# Patient Record
Sex: Male | Born: 1965 | Race: White | Hispanic: No | Marital: Married | Smoking: Never smoker
Health system: Southern US, Community
[De-identification: ages and names within clinical notes are randomized; demographics above are authoritative.]

## PROBLEM LIST (undated history)

## (undated) DIAGNOSIS — J302 Other seasonal allergic rhinitis: Secondary | ICD-10-CM

## (undated) DIAGNOSIS — M1990 Unspecified osteoarthritis, unspecified site: Secondary | ICD-10-CM

## (undated) DIAGNOSIS — M7540 Impingement syndrome of unspecified shoulder: Secondary | ICD-10-CM

## (undated) HISTORY — DX: Other seasonal allergic rhinitis: J30.2

## (undated) HISTORY — PX: HAND SURGERY: SHX662

---

## 2008-03-11 ENCOUNTER — Emergency Department (HOSPITAL_COMMUNITY): Admission: EM | Admit: 2008-03-11 | Discharge: 2008-03-12 | Payer: Self-pay | Admitting: Emergency Medicine

## 2010-03-23 ENCOUNTER — Ambulatory Visit: Payer: Self-pay | Admitting: Family Medicine

## 2010-03-23 DIAGNOSIS — M84369A Stress fracture, unspecified tibia and fibula, initial encounter for fracture: Secondary | ICD-10-CM

## 2010-03-23 DIAGNOSIS — M79609 Pain in unspecified limb: Secondary | ICD-10-CM

## 2010-10-19 NOTE — Assessment & Plan Note (Signed)
Summary: NP RUNNER RT ANKLE PAIN/SWELLING   Vital Signs:  Patient profile:   45 year old male Height:      72 inches Weight:      190 pounds BMI:     25.86 BP sitting:   122 / 81  Vitals Entered By: Lillia Pauls CMA (March 23, 2010 10:10 AM)  History of Present Illness: 45 yo male with right ankle pain and swelling x 2-3 weeks, acutely worsening the past 7 days. s/p injury and fracture of right fibula about 2 years ago while playing soccer.  At that time he was seen at [facility], placed in boot/cast for about 2 months; no surgery needed, did a few sessions of PT.  Pt reports minimal formal PT and HEP after injury.   Recently has begun training for a marathon (end of June); over about 2 weeks has been increasing mileage from baseline of 3-5 miles weekly, to current distance of 20-25 miles weekly. Runs on concrete roads. Endorses pain on medial malleoli and at the distal tibia.  Pain worsens as distance increases.  Describes pain as bone pain with no radiation.  No weakness in ankle or foot, no recent injuries.  Noticed swelling after run last week which responded well to rest, ice and Ibuprofen.     Allergies (verified): No Known Drug Allergies  Past History:  Past Medical History: h/o R fibular fx, ? mortise disruption or other medial injury, non-op, Dr. Eulah Pont  Family History: n/c  Social History: pleasant runner  Review of Systems       REVIEW OF SYSTEMS  GEN: No systemic complaints, no fevers, chills, sweats, or other acute illnesses MSK: Detailed in the HPI GI: tolerating PO intake without difficulty Neuro: No numbness, parasthesias, or tingling associated. Otherwise the pertinent positives of the ROS are noted above.    Physical Exam  Head:  Normocephalic and atraumatic.  Ears:  ext normal Nose:  ext normal Lungs:  breathing comfortably   Foot/Ankle Exam  General:    Well-developed, well-nourished ,normal body habitus; no deformities, Vs  reviewed  Gait:    Normal heel-toe gait pattern bilaterally.    Vascular:    dorsalis pedis and posterior tibial pulses 2+ and symmetric, capillary refill < 2 seconds, normal hair pattern, no evidence of ischemia.   Sensory:    gross sensation intact bilaterally in lower extremities.    Motor:    Motor strength 5/5 bilaterally for ankle dorsiflexion, ankle plantar flexion, ankle inversion and ankle eversion.    Reflexes:    Normal and symmetric Achilles reflexes bilaterally.    Ankle Exam:    Right:    Inspection:  Normal    Palpation:  Abnormal       Location:  medial malleolus    Stability:  stable    Tenderness:  yes    Swelling:  yes    Erythema:  no    Mild swelling noted at medial malleolus. Tenderness at medial malleolus and distal tibia  + PAIN WITH HOP TEST BUT ABLE TO COMPLETE 10 B    Range of Motion:       Dorsiflex-Active: 60       Plantar Flex-Active: 45       Eversion-Active: 45       Inversion-Active: 75       Dorsiflex-Passive: 60       Plantar Flex-Passive: 45       Eversion-Passive: 45       Inversion-Passive: 75  Transverse Tarsal: full    Left:    Inspection:  Normal    Palpation:  Normal    Stability:  stable    Tenderness:  no    Swelling:  no    Erythema:  no    Range of Motion:       Dorsiflex-Active: 60       Plantar Flex-Active: 45       Eversion-Active: 45       Inversion-Active: 75       Dorsiflex-Passive: 60       Plantar Flex-Passive: 45       Eversion-Passive: 45       Inversion-Passive: 75       Transverse Tarsal: full  Foot Exam:    Right:    Inspection:  Normal    Palpation:  Normal    Stability:  stable    Tenderness:  no    Swelling:  no    Erythema:  no    Range of Motion:       Hallux MTP Dorsiflex-Active: 60       Hallux MTP Plantar Flex-Active: 45       Hallux IP-Active: full       Hallux MTP Dorsiflex-Passive: 60       Hallux MTP Plantar Flex-Passive: 45       Hallux IP-Passive: full    Left:     Inspection:  Normal    Palpation:  Normal    Stability:  stable    Tenderness:  no    Swelling:  no    Erythema:  no    Range of Motion:       Hallux MTP Dorsiflex-Active: 60       Hallux MTP Plantar Flex-Active: 45       Hallux IP-Active: full       Hallux MTP Dorsiflex-Passive: 60       Hallux MTP Plantar Flex-Passive: 45       Hallux IP-Passive: full  Anterior Drawer:    Right negative; Left negative Syndesmosis Squeeze Test:    Right negative; Left negative Ankle External Rotation Test:    Right negative; Left negative   Impression & Recommendations:  Problem # 1:  LEG PAIN, RIGHT (ICD-729.5) Assessment New Likely due to acute overuse and sudden increase in distance running - suspect distal tib stress reaction   Advised no running at this time since  impact will worsen symptoms. OK to cycle and swim as long as he does not have pain. Re-iterated that he should modify his activities to avoid pain and worsening his symptoms. Ice for 20 minutes daily Can use  NSAIDs as long as he is not engaging in long distance runs especially in the heat.  Return in 2 weeks for follow-up or sooner if symptoms worsen  Problem # 2:  STRESS FRACTURE OF TIBIA OR FIBULA (ICD-733.93) Assessment: New c/w stress reaction, distal medial tibia

## 2015-05-12 ENCOUNTER — Other Ambulatory Visit: Payer: Self-pay | Admitting: Orthopedic Surgery

## 2015-05-12 DIAGNOSIS — M25511 Pain in right shoulder: Secondary | ICD-10-CM

## 2015-05-19 ENCOUNTER — Ambulatory Visit
Admission: RE | Admit: 2015-05-19 | Discharge: 2015-05-19 | Disposition: A | Discharge status: Home / Self Care | Payer: No Typology Code available for payment source | Source: Ambulatory Visit | Attending: Orthopedic Surgery | Admitting: Orthopedic Surgery

## 2015-05-19 DIAGNOSIS — M25511 Pain in right shoulder: Secondary | ICD-10-CM

## 2015-06-30 ENCOUNTER — Other Ambulatory Visit: Payer: Self-pay | Admitting: Physician Assistant

## 2015-06-30 NOTE — H&P (Signed)
Jay Williams is an old patient of mine, as well as a friend.  I haven't seen him in a couple of years.  I have talked to him on a number of occasions over the last couple of years about increasing right shoulder pain.  All of this located in and around the rotator cuff.  It has gotten to a point that he has had to give up trying to hit tennis balls overhead.  Any throwing motion is bothering him more and more.  He is able to play golf, but this is his right, not his left, shoulder and he is right handed.  He is starting to get some rest pain and night pain.  Alteration of activities and anti-inflammatories without improvement.  He has not had an injection or further workup.  No symptoms on the left.  Otherwise great health. History and general exam is reviewed, updated and included in the chart.   EXAMINATION: Specifically, healthy appearing 49 year-old male.  I can get his right shoulder through full motion, but he has positive impingement, positive palms down abduction and a little give way weakness.  AC joint a little sore.  Biceps is intact.  No apprehension or instability.  Opposite left shoulder full motion.  No impingement signs.  Good strength.    X-RAYS: Three view x-ray of the right shows a Type II acromion.  A little bit of spurring.  Reasonable subacromial space.  Moderate changes AC joint.  Subacromial space and glenohumeral joint look good.   IMPRESSION: Chronic persistent impingement, right dominant shoulder.  A number of years.    PLAN: I think this is going to come down to operative treatment based on longevity.  We are going to try a subacromial injection, followed by Jobe exercise program.  MRI to look at his cuff.  Depending on his response to injection and what his scan looks like we will make a decision about treatment.  He understands and agrees.  He will call me after the scan.    PROCEDURE NOTE: The patient's clinical condition is marked by substantial pain and/or significant  functional disability.  Other conservative therapy has not provided relief, is contraindicated, or not appropriate.  There is a reasonable likelihood that injection will significantly improve the patient's pain and/or functional disability. After appropriate consent and under sterile technique subacromial injection of the right shoulder from a posterior approach with 40 mg of Depo-Medrol and Marcaine.  Tolerated this well.  I went over Jobe exercises.    Ninetta Lights, M.D.  Addendum:  I met and spoke with Jay Williams in regards to the scan of his right shoulder.  He continues to have significant symptoms.  MRI has been completed and reviewed.  This shows significant arthropathy at the [facility] joint causing impingement.  Type II acromion.  His cuff otherwise is intact, as is his biceps tendon, which is what I expected.  He also has an anterior labrum tear with a paralabral cyst, but I don't think this represents a true SLAP lesion.  I have gone over the results and discussed them and reviewed them with Jay Williams.  He has had symptoms now for almost two years.  Only transient improvement with injection and an exercise program.  We have discussed definitive treatment.  He would like to proceed.  We are going to try to do this later this fall.  Exam under anesthesia, arthroscopy.  Debridement of his labral tear and labral cyst.  I don't think I am going to  have to do anything else in regards to that part of the pathology.  Subacromial decompression, bursectomy and distal clavicle excision.  Procedure, risks, benefits and complications reviewed.  Paperwork complete.  I will see him at the time of operative intervention.    Ninetta Lights, M.D.

## 2015-07-07 ENCOUNTER — Encounter (HOSPITAL_BASED_OUTPATIENT_CLINIC_OR_DEPARTMENT_OTHER): Payer: Self-pay | Admitting: *Deleted

## 2015-07-09 ENCOUNTER — Ambulatory Visit (HOSPITAL_BASED_OUTPATIENT_CLINIC_OR_DEPARTMENT_OTHER): Payer: No Typology Code available for payment source | Admitting: Anesthesiology

## 2015-07-09 ENCOUNTER — Encounter (HOSPITAL_BASED_OUTPATIENT_CLINIC_OR_DEPARTMENT_OTHER)
Admission: RE | Disposition: A | Discharge status: Home / Self Care | Payer: Self-pay | Source: Ambulatory Visit | Attending: Orthopedic Surgery

## 2015-07-09 ENCOUNTER — Ambulatory Visit (HOSPITAL_BASED_OUTPATIENT_CLINIC_OR_DEPARTMENT_OTHER)
Admission: RE | Admit: 2015-07-09 | Discharge: 2015-07-09 | Disposition: A | Discharge status: Home / Self Care | Payer: No Typology Code available for payment source | Source: Ambulatory Visit | Attending: Orthopedic Surgery | Admitting: Orthopedic Surgery

## 2015-07-09 ENCOUNTER — Encounter (HOSPITAL_BASED_OUTPATIENT_CLINIC_OR_DEPARTMENT_OTHER): Payer: Self-pay | Admitting: *Deleted

## 2015-07-09 DIAGNOSIS — M89511 Osteolysis, right shoulder: Secondary | ICD-10-CM | POA: Diagnosis present

## 2015-07-09 DIAGNOSIS — X58XXXA Exposure to other specified factors, initial encounter: Secondary | ICD-10-CM | POA: Diagnosis not present

## 2015-07-09 DIAGNOSIS — S43431A Superior glenoid labrum lesion of right shoulder, initial encounter: Secondary | ICD-10-CM | POA: Insufficient documentation

## 2015-07-09 DIAGNOSIS — M7541 Impingement syndrome of right shoulder: Secondary | ICD-10-CM | POA: Diagnosis not present

## 2015-07-09 HISTORY — DX: Impingement syndrome of unspecified shoulder: M75.40

## 2015-07-09 HISTORY — PX: SHOULDER ARTHROSCOPY WITH DISTAL CLAVICLE RESECTION: SHX5675

## 2015-07-09 HISTORY — DX: Unspecified osteoarthritis, unspecified site: M19.90

## 2015-07-09 HISTORY — PX: SHOULDER ARTHROSCOPY WITH SUBACROMIAL DECOMPRESSION: SHX5684

## 2015-07-09 SURGERY — SHOULDER ARTHROSCOPY WITH SUBACROMIAL DECOMPRESSION
Anesthesia: General | Site: Shoulder | Laterality: Right

## 2015-07-09 MED ORDER — ONDANSETRON HCL 4 MG/2ML IJ SOLN: 4.0000 mg | Freq: Once | INTRAMUSCULAR | Status: DC | PRN

## 2015-07-09 MED ORDER — FENTANYL CITRATE (PF) 100 MCG/2ML IJ SOLN: 25.0000 ug | INTRAMUSCULAR | Status: DC | PRN

## 2015-07-09 MED ORDER — METOCLOPRAMIDE HCL 5 MG PO TABS: 5.0000 mg | ORAL_TABLET | Freq: Three times a day (TID) | ORAL | Status: DC | PRN

## 2015-07-09 MED ORDER — LIDOCAINE HCL (CARDIAC) 20 MG/ML IV SOLN
INTRAVENOUS | Status: AC
  Filled 2015-07-09: qty 5

## 2015-07-09 MED ORDER — DEXAMETHASONE SODIUM PHOSPHATE 10 MG/ML IJ SOLN
INTRAMUSCULAR | Status: AC
  Filled 2015-07-09: qty 1

## 2015-07-09 MED ORDER — FENTANYL CITRATE (PF) 100 MCG/2ML IJ SOLN
25.0000 ug | INTRAMUSCULAR | Status: DC | PRN
Start: 2015-07-09 — End: 2015-07-09

## 2015-07-09 MED ORDER — METOCLOPRAMIDE HCL 5 MG/ML IJ SOLN: 5.0000 mg | Freq: Three times a day (TID) | INTRAMUSCULAR | Status: DC | PRN

## 2015-07-09 MED ORDER — SCOPOLAMINE 1 MG/3DAYS TD PT72: 1.0000 | MEDICATED_PATCH | Freq: Once | TRANSDERMAL | Status: DC | PRN

## 2015-07-09 MED ORDER — LACTATED RINGERS IV SOLN
INTRAVENOUS | Status: DC
  Administered 2015-07-09: 09:00:00 via INTRAVENOUS

## 2015-07-09 MED ORDER — MIDAZOLAM HCL 2 MG/2ML IJ SOLN
INTRAMUSCULAR | Status: AC
  Filled 2015-07-09: qty 2

## 2015-07-09 MED ORDER — CHLORHEXIDINE GLUCONATE 4 % EX LIQD: 60.0000 mL | Freq: Once | CUTANEOUS | Status: DC

## 2015-07-09 MED ORDER — ONDANSETRON HCL 4 MG/2ML IJ SOLN: 4.0000 mg | Freq: Four times a day (QID) | INTRAMUSCULAR | Status: DC | PRN

## 2015-07-09 MED ORDER — CEFAZOLIN SODIUM-DEXTROSE 2-3 GM-% IV SOLR
2.0000 g | INTRAVENOUS | Status: AC
  Administered 2015-07-09: 2 g via INTRAVENOUS

## 2015-07-09 MED ORDER — CEFAZOLIN SODIUM-DEXTROSE 2-3 GM-% IV SOLR
INTRAVENOUS | Status: AC
  Filled 2015-07-09: qty 50

## 2015-07-09 MED ORDER — FENTANYL CITRATE (PF) 100 MCG/2ML IJ SOLN
50.0000 ug | INTRAMUSCULAR | Status: DC | PRN
  Administered 2015-07-09 (×2): 100 ug via INTRAVENOUS

## 2015-07-09 MED ORDER — ONDANSETRON HCL 4 MG PO TABS: 4.0000 mg | ORAL_TABLET | Freq: Four times a day (QID) | ORAL | Status: DC | PRN

## 2015-07-09 MED ORDER — GLYCOPYRROLATE 0.2 MG/ML IJ SOLN: 0.2000 mg | Freq: Once | INTRAMUSCULAR | Status: DC | PRN

## 2015-07-09 MED ORDER — PROPOFOL 10 MG/ML IV BOLUS
INTRAVENOUS | Status: DC | PRN
  Administered 2015-07-09: 200 mg via INTRAVENOUS

## 2015-07-09 MED ORDER — ONDANSETRON HCL 4 MG/2ML IJ SOLN
INTRAMUSCULAR | Status: AC
  Filled 2015-07-09: qty 2

## 2015-07-09 MED ORDER — MIDAZOLAM HCL 2 MG/2ML IJ SOLN
1.0000 mg | INTRAMUSCULAR | Status: DC | PRN
  Administered 2015-07-09: 2 mg via INTRAVENOUS

## 2015-07-09 MED ORDER — OXYCODONE-ACETAMINOPHEN 5-325 MG PO TABS: 1.0000 | ORAL_TABLET | ORAL | Status: DC | PRN

## 2015-07-09 MED ORDER — LACTATED RINGERS IV SOLN: INTRAVENOUS | Status: DC

## 2015-07-09 MED ORDER — SUCCINYLCHOLINE CHLORIDE 20 MG/ML IJ SOLN
INTRAMUSCULAR | Status: DC | PRN
  Administered 2015-07-09: 100 mg via INTRAVENOUS

## 2015-07-09 MED ORDER — FENTANYL CITRATE (PF) 100 MCG/2ML IJ SOLN
INTRAMUSCULAR | Status: AC
  Filled 2015-07-09: qty 2

## 2015-07-09 MED ORDER — BUPIVACAINE HCL (PF) 0.25 % IJ SOLN
INTRAMUSCULAR | Status: AC
Start: 2015-07-09 — End: 2015-07-09
  Filled 2015-07-09: qty 30

## 2015-07-09 MED ORDER — METHOCARBAMOL 1000 MG/10ML IJ SOLN: 500.0000 mg | Freq: Four times a day (QID) | INTRAVENOUS | Status: DC | PRN

## 2015-07-09 MED ORDER — DEXAMETHASONE SODIUM PHOSPHATE 4 MG/ML IJ SOLN
INTRAMUSCULAR | Status: DC | PRN
  Administered 2015-07-09: 10 mg via INTRAVENOUS

## 2015-07-09 MED ORDER — BUPIVACAINE-EPINEPHRINE (PF) 0.5% -1:200000 IJ SOLN
INTRAMUSCULAR | Status: DC | PRN
  Administered 2015-07-09: 25 mL

## 2015-07-09 MED ORDER — METHOCARBAMOL 500 MG PO TABS: 500.0000 mg | ORAL_TABLET | Freq: Four times a day (QID) | ORAL | Status: DC | PRN

## 2015-07-09 MED ORDER — ONDANSETRON HCL 4 MG PO TABS: 4.0000 mg | ORAL_TABLET | Freq: Three times a day (TID) | ORAL | Status: DC | PRN

## 2015-07-09 MED ORDER — SUCCINYLCHOLINE CHLORIDE 20 MG/ML IJ SOLN
INTRAMUSCULAR | Status: AC
  Filled 2015-07-09: qty 1

## 2015-07-09 MED ORDER — LIDOCAINE HCL (CARDIAC) 20 MG/ML IV SOLN
INTRAVENOUS | Status: DC | PRN
  Administered 2015-07-09: 50 mg via INTRAVENOUS

## 2015-07-09 MED ORDER — FENTANYL CITRATE (PF) 100 MCG/2ML IJ SOLN
INTRAMUSCULAR | Status: AC
  Filled 2015-07-09: qty 4

## 2015-07-09 SURGICAL SUPPLY — 73 items
APL SKNCLS STERI-STRIP NONHPOA (GAUZE/BANDAGES/DRESSINGS)
BENZOIN TINCTURE PRP APPL 2/3 (GAUZE/BANDAGES/DRESSINGS) IMPLANT
BLADE CUTTER GATOR 3.5 (BLADE) ×3 IMPLANT
BLADE CUTTER MENIS 5.5 (BLADE) IMPLANT
BLADE GREAT WHITE 4.2 (BLADE) ×2 IMPLANT
BLADE GREAT WHITE 4.2MM (BLADE) ×1
BLADE SURG 15 STRL LF DISP TIS (BLADE) IMPLANT
BLADE SURG 15 STRL SS (BLADE)
BUR OVAL 6.0 (BURR) ×3 IMPLANT
CANNULA DRY DOC 8X75 (CANNULA) IMPLANT
CANNULA TWIST IN 8.25X7CM (CANNULA) IMPLANT
CLOSURE WOUND 1/2 X4 (GAUZE/BANDAGES/DRESSINGS)
DECANTER SPIKE VIAL GLASS SM (MISCELLANEOUS) IMPLANT
DRAPE OEC MINIVIEW 54X84 (DRAPES) IMPLANT
DRAPE STERI 35X30 U-POUCH (DRAPES) ×3 IMPLANT
DRAPE U-SHAPE 47X51 STRL (DRAPES) ×3 IMPLANT
DRAPE U-SHAPE 76X120 STRL (DRAPES) ×6 IMPLANT
DRSG PAD ABDOMINAL 8X10 ST (GAUZE/BANDAGES/DRESSINGS) ×3 IMPLANT
DURAPREP 26ML APPLICATOR (WOUND CARE) ×3 IMPLANT
ELECT MENISCUS 165MM 90D (ELECTRODE) ×3 IMPLANT
ELECT REM PT RETURN 9FT ADLT (ELECTROSURGICAL) ×3
ELECTRODE REM PT RTRN 9FT ADLT (ELECTROSURGICAL) ×1 IMPLANT
GAUZE SPONGE 4X4 12PLY STRL (GAUZE/BANDAGES/DRESSINGS) ×6 IMPLANT
GAUZE XEROFORM 1X8 LF (GAUZE/BANDAGES/DRESSINGS) ×3 IMPLANT
GLOVE BIO SURGEON STRL SZ 6.5 (GLOVE) ×1 IMPLANT
GLOVE BIO SURGEONS STRL SZ 6.5 (GLOVE) ×1
GLOVE BIOGEL PI IND STRL 7.0 (GLOVE) ×1 IMPLANT
GLOVE BIOGEL PI INDICATOR 7.0 (GLOVE) ×4
GLOVE ECLIPSE 7.0 STRL STRAW (GLOVE) ×3 IMPLANT
GLOVE SURG ORTHO 8.0 STRL STRW (GLOVE) ×3 IMPLANT
GOWN STRL REUS W/ TWL LRG LVL3 (GOWN DISPOSABLE) ×2 IMPLANT
GOWN STRL REUS W/ TWL XL LVL3 (GOWN DISPOSABLE) ×1 IMPLANT
GOWN STRL REUS W/TWL LRG LVL3 (GOWN DISPOSABLE) ×6
GOWN STRL REUS W/TWL XL LVL3 (GOWN DISPOSABLE) ×3
IV NS IRRIG 3000ML ARTHROMATIC (IV SOLUTION) ×12 IMPLANT
MANIFOLD NEPTUNE II (INSTRUMENTS) ×3 IMPLANT
NDL SCORPION MULTI FIRE (NEEDLE) IMPLANT
NDL SUT 6 .5 CRC .975X.05 MAYO (NEEDLE) IMPLANT
NEEDLE MAYO TAPER (NEEDLE)
NEEDLE SCORPION MULTI FIRE (NEEDLE) IMPLANT
NS IRRIG 1000ML POUR BTL (IV SOLUTION) IMPLANT
PACK ARTHROSCOPY DSU (CUSTOM PROCEDURE TRAY) ×3 IMPLANT
PACK BASIN DAY SURGERY FS (CUSTOM PROCEDURE TRAY) ×3 IMPLANT
PASSER SUT SWANSON 36MM LOOP (INSTRUMENTS) IMPLANT
PENCIL BUTTON HOLSTER BLD 10FT (ELECTRODE) ×3 IMPLANT
SET ARTHROSCOPY TUBING (MISCELLANEOUS) ×3
SET ARTHROSCOPY TUBING LN (MISCELLANEOUS) ×1 IMPLANT
SLEEVE SCD COMPRESS KNEE MED (MISCELLANEOUS) IMPLANT
SLING ARM IMMOBILIZER LRG (SOFTGOODS) IMPLANT
SLING ARM IMMOBILIZER MED (SOFTGOODS) IMPLANT
SLING ARM LRG ADULT FOAM STRAP (SOFTGOODS) IMPLANT
SLING ARM MED ADULT FOAM STRAP (SOFTGOODS) IMPLANT
SLING ARM XL FOAM STRAP (SOFTGOODS) IMPLANT
SPONGE LAP 4X18 X RAY DECT (DISPOSABLE) IMPLANT
STRIP CLOSURE SKIN 1/2X4 (GAUZE/BANDAGES/DRESSINGS) IMPLANT
SUCTION FRAZIER TIP 10 FR DISP (SUCTIONS) IMPLANT
SUT ETHIBOND 2 OS 4 DA (SUTURE) IMPLANT
SUT ETHILON 2 0 FS 18 (SUTURE) IMPLANT
SUT ETHILON 3 0 PS 1 (SUTURE) IMPLANT
SUT FIBERWIRE #2 38 T-5 BLUE (SUTURE)
SUT RETRIEVER MED (INSTRUMENTS) IMPLANT
SUT TIGER TAPE 7 IN WHITE (SUTURE) IMPLANT
SUT VIC AB 0 CT1 27 (SUTURE)
SUT VIC AB 0 CT1 27XBRD ANBCTR (SUTURE) IMPLANT
SUT VIC AB 2-0 SH 27 (SUTURE)
SUT VIC AB 2-0 SH 27XBRD (SUTURE) IMPLANT
SUT VIC AB 3-0 FS2 27 (SUTURE) IMPLANT
SUTURE FIBERWR #2 38 T-5 BLUE (SUTURE) IMPLANT
TAPE FIBER 2MM 7IN #2 BLUE (SUTURE) IMPLANT
TOWEL OR 17X24 6PK STRL BLUE (TOWEL DISPOSABLE) ×3 IMPLANT
TOWEL OR NON WOVEN STRL DISP B (DISPOSABLE) ×3 IMPLANT
WATER STERILE IRR 1000ML POUR (IV SOLUTION) ×3 IMPLANT
YANKAUER SUCT BULB TIP NO VENT (SUCTIONS) IMPLANT

## 2015-07-09 NOTE — Anesthesia Postprocedure Evaluation (Signed)
  Anesthesia Post-op Note  Patient: Jay Williams  Procedure(s) Performed: Procedure(s) (LRB): RIGHT SHOULDER ARTHROSCOPY, DEBRIDEMENT, ACROMIOPLASTY WITH DISTAL CLAVICAL EXCISION debride labrum (Right)  Patient Location: PACU  Anesthesia Type: General  Level of Consciousness: awake and alert   Airway and Oxygen Therapy: Patient Spontanous Breathing  Post-op Pain: mild  Post-op Assessment: Post-op Vital signs reviewed, Patient's Cardiovascular Status Stable, Respiratory Function Stable, Patent Airway and No signs of Nausea or vomiting  Last Vitals:  Filed Vitals:   07/09/15 1115  BP: 112/77  Pulse: 57  Temp:   Resp: 10    Post-op Vital Signs: stable   Complications: No apparent anesthesia complications

## 2015-07-09 NOTE — Transfer of Care (Signed)
Immediate Anesthesia Transfer of Care Note  Patient: Jay Williams  Procedure(s) Performed: Procedure(s): RIGHT SHOULDER ARTHROSCOPY, DEBRIDEMENT, ACROMIOPLASTY WITH DISTAL CLAVICAL EXCISION debride labrum (Right)  Patient Location: PACU  Anesthesia Type:General  Level of Consciousness: awake and sedated  Airway & Oxygen Therapy: Patient Spontanous Breathing and Patient connected to face mask oxygen  Post-op Assessment: Report given to RN and Post -op Vital signs reviewed and stable  Post vital signs: Reviewed and stable  Last Vitals:  Filed Vitals:   07/09/15 0930  BP: 134/69  Pulse: 63  Temp:   Resp: 12    Complications: No apparent anesthesia complications

## 2015-07-09 NOTE — Progress Notes (Signed)
Assisted Dr. Imogene Burn with right, ultrasound guided, interscalene  block. Side rails up, monitors on throughout procedure. See vital signs in flow sheet. Tolerated Procedure well.

## 2015-07-09 NOTE — Interval H&P Note (Signed)
History and Physical Interval Note:  07/09/2015 7:29 AM  Jay Williams  has presented today for surgery, with the diagnosis of Right shoulder impingement  The various methods of treatment have been discussed with the patient and family. After consideration of risks, benefits and other options for treatment, the patient has consented to  Procedure(s): RIGHT SHOULDER ARTHROSCOPY, DEBRIDEMENT, ACROMIOPLASTY WITH DISTAL CLAVICAL EXCISION (Right) as a surgical intervention .  The patient's history has been reviewed, patient examined, no change in status, stable for surgery.  I have reviewed the patient's chart and labs.  Questions were answered to the patient's satisfaction.     Ninetta Lights

## 2015-07-09 NOTE — Discharge Instructions (Signed)
Shouder arthroscopy, partial rotator cuff tear debridement subacromial decompression Care After Instructions Refer to this sheet in the next few weeks. These discharge instructions provide you with general information on caring for yourself after you leave the hospital. Your caregiver may also give you specific instructions. Your treatment has been planned according to the most current medical practices available, but unavoidable complications sometimes occur. If you have any problems or questions after discharge, please call your caregiver. HOME INSTRUCTIONS You may resume a normal diet and activities as directed. Take showers instead of baths until informed otherwise.  Change bandages (dressings) in 3 days.  Swab wounds daily with betadine.  Wash shoulder with soap and water.  Pat dry.  Cover wounds with bandaids. Only take over-the-counter or prescription medicines for pain, discomfort, or fever as directed by your caregiver.  Wear your sling for the next 2 days unless otherwise instructed. Eat a well-balanced diet.  Avoid lifting or driving until you are instructed otherwise.  Make an appointment to see your caregiver for stitches (suture) or staple removal one week after surgery.  SEEK MEDICAL CARE IF: You have swelling of your calf or leg.  You develop shortness of breath or chest pain.  You have redness, swelling, or increasing pain in the wound.  There is pus or any unusual drainage coming from the surgical site.  You notice a bad smell coming from the surgical site or dressing.  The surgical site breaks open after sutures or staples have been removed.  There is persistent bleeding from the suture or staple line.  You are getting worse or are not improving.  You have any other questions or concerns.  SEEK IMMEDIATE MEDICAL CARE IF:  You have a fever greater than 101 You develop a rash.  You have difficulty breathing.  You develop any reaction or side effects to medicines given.    Your knee motion is decreasing rather than improving.  MAKE SURE YOU:  Understand these instructions.  Will watch your condition.  Will get help right away if you are not doing well or get worse.    Post Anesthesia Home Care Instructions  Activity: Get plenty of rest for the remainder of the day. A responsible adult should stay with you for 24 hours following the procedure.  For the next 24 hours, DO NOT: -Drive a car -Paediatric nurse -Drink alcoholic beverages -Take any medication unless instructed by your physician -Make any legal decisions or sign important papers.  Meals: Start with liquid foods such as gelatin or soup. Progress to regular foods as tolerated. Avoid greasy, spicy, heavy foods. If nausea and/or vomiting occur, drink only clear liquids until the nausea and/or vomiting subsides. Call your physician if vomiting continues.  Special Instructions/Symptoms: Your throat may feel dry or sore from the anesthesia or the breathing tube placed in your throat during surgery. If this causes discomfort, gargle with warm salt water. The discomfort should disappear within 24 hours.  If you had a scopolamine patch placed behind your ear for the management of post- operative nausea and/or vomiting:  1. The medication in the patch is effective for 72 hours, after which it should be removed.  Wrap patch in a tissue and discard in the trash. Wash hands thoroughly with soap and water. 2. You may remove the patch earlier than 72 hours if you experience unpleasant side effects which may include dry mouth, dizziness or visual disturbances. 3. Avoid touching the patch. Wash your hands with soap and water after contact  patch. °  ° ° °

## 2015-07-09 NOTE — Anesthesia Procedure Notes (Addendum)
Anesthesia Regional Block:  Interscalene brachial plexus block  Pre-Anesthetic Checklist: ,, timeout performed, Correct Patient, Correct Site, Correct Laterality, Correct Procedure, Correct Position, site marked, Risks and benefits discussed,  Surgical consent,  Pre-op evaluation,  At surgeon's request and post-op pain management  Laterality: Right  Prep: chloraprep       Needles:  Injection technique: Single-shot  Needle Type: Echogenic Stimulator Needle      Needle Gauge: 21 and 21 G    Additional Needles:  Procedures: ultrasound guided (picture in chart) Interscalene brachial plexus block Narrative:  Injection made incrementally with aspirations every 5 mL.  Performed by: Personally  Anesthesiologist: JUDD, MARY  Additional Notes: Risks, benefits and alternative to block explained extensively.  Patient tolerated procedure well, without complications.   Procedure Name: Intubation Performed by: Terrance Mass Pre-anesthesia Checklist: Patient identified, Emergency Drugs available, Suction available and Patient being monitored Patient Re-evaluated:Patient Re-evaluated prior to inductionOxygen Delivery Method: Circle System Utilized Preoxygenation: Pre-oxygenation with 100% oxygen Intubation Type: IV induction Ventilation: Mask ventilation without difficulty Laryngoscope Size: Miller and 2 Tube type: Oral Number of attempts: 1 Airway Equipment and Method: Stylet and Oral airway Placement Confirmation: ETT inserted through vocal cords under direct vision,  positive ETCO2 and breath sounds checked- equal and bilateral Secured at: 23 cm Tube secured with: Tape Dental Injury: Teeth and Oropharynx as per pre-operative assessment

## 2015-07-09 NOTE — Anesthesia Postprocedure Evaluation (Signed)
  Anesthesia Post-op Note  Patient: Jay Williams  Procedure(s) Performed: Procedure(s) (LRB): RIGHT SHOULDER ARTHROSCOPY, DEBRIDEMENT, ACROMIOPLASTY WITH DISTAL CLAVICAL EXCISION debride labrum (Right)  Patient Location: PACU  Anesthesia Type: GA combined with regional for post-op pain  Level of Consciousness: awake and alert   Airway and Oxygen Therapy: Patient Spontanous Breathing  Post-op Pain: mild  Post-op Assessment: Post-op Vital signs reviewed, Patient's Cardiovascular Status Stable, Respiratory Function Stable, Patent Airway and No signs of Nausea or vomiting  Last Vitals:  Filed Vitals:   07/09/15 1155  BP: 120/75  Pulse: 57  Temp: 36.4 C  Resp: 12    Post-op Vital Signs: stable   Complications: No apparent anesthesia complications

## 2015-07-09 NOTE — Anesthesia Preprocedure Evaluation (Signed)
Anesthesia Evaluation  Patient identified by MRN, date of birth, ID band Patient awake    Reviewed: Allergy & Precautions, NPO status , Patient's Chart, lab work & pertinent test results  History of Anesthesia Complications Negative for: history of anesthetic complications  Airway Mallampati: II  TM Distance: >3 FB Neck ROM: Full    Dental no notable dental hx. (+) Dental Advisory Given   Pulmonary Current Smoker,    Pulmonary exam normal breath sounds clear to auscultation       Cardiovascular negative cardio ROS Normal cardiovascular exam Rhythm:Regular Rate:Normal     Neuro/Psych negative neurological ROS  negative psych ROS   GI/Hepatic negative GI ROS, Neg liver ROS,   Endo/Other  negative endocrine ROS  Renal/GU negative Renal ROS  negative genitourinary   Musculoskeletal  (+) Arthritis ,   Abdominal   Peds negative pediatric ROS (+)  Hematology negative hematology ROS (+)   Anesthesia Other Findings   Reproductive/Obstetrics negative OB ROS                             Anesthesia Physical Anesthesia Plan  ASA: II  Anesthesia Plan: General   Post-op Pain Management: GA combined w/ Regional for post-op pain   Induction: Intravenous  Airway Management Planned: Oral ETT  Additional Equipment:   Intra-op Plan:   Post-operative Plan: Extubation in OR  Informed Consent: I have reviewed the patients History and Physical, chart, labs and discussed the procedure including the risks, benefits and alternatives for the proposed anesthesia with the patient or authorized representative who has indicated his/her understanding and acceptance.   Dental advisory given  Plan Discussed with: CRNA  Anesthesia Plan Comments:         Anesthesia Quick Evaluation

## 2015-07-10 ENCOUNTER — Encounter (HOSPITAL_BASED_OUTPATIENT_CLINIC_OR_DEPARTMENT_OTHER): Payer: Self-pay | Admitting: Orthopedic Surgery

## 2015-07-10 NOTE — Op Note (Signed)
Jay Williams, Jay Williams NO.:  [id number]  MEDICAL RECORD NO.:  [id number]  LOCATION:                               FACILITY:  [facility]  PHYSICIAN:  Ninetta Lights, M.D. DATE OF BIRTH:  [birth year]  DATE OF PROCEDURE:  07/09/2015 DATE OF DISCHARGE:  07/09/2015                              OPERATIVE REPORT   PREOPERATIVE DIAGNOSES:  Right shoulder distal clavicle osteolysis, subacromial impingement.  Anterior labral tear with paralabral cyst.  POSTOPERATIVE DIAGNOSES:  Right shoulder distal clavicle osteolysis, subacromial impingement.  Anterior labral tear with paralabral cyst with also some complex tearing of posterior aspect of the labrum.  No instability.  Grade 4 changes of AC joint, marked reactive bursitis. Mild abrasive changes on the top of the cuff going below the [facility] spurs.  PROCEDURE:  Right shoulder exam under anesthesia, arthroscopy. Debridement of labrum paralabral cyst.  Bursectomy, acromioplasty, coracoacromial ligament release.  Excision of distal clavicle.  SURGEON:  Ninetta Lights, M.D.  ASSISTANT:  Elmyra Ricks, PA, present throughout the entire case and necessary for timely completion of procedure.  ANESTHESIA:  General.  BLOOD LOSS:  Minimal.  SPECIMENS:  None.  CULTURES:  None.  COMPLICATIONS:  None.  DRESSINGS:  Soft compressive with sling.  DESCRIPTION OF PROCEDURE:  The patient was brought to the operating room, placed on the operating table in supine position.  After adequate anesthesia had been obtained, shoulder examined.  Full motion, stable shoulder.  Placed in a beach-chair position on the shoulder positioner, prepped and draped in usual sterile fashion.  Three portals anterior, posterior, and lateral.  Arthroscope introduced, shoulder was then inspected.  Complex tearing, anterior labrum midportion and down around the bottom.  These were debrided.  A little separation of the capsular labral interface, but not a  true Bankart lesion.  A paralabral cyst debrided as well.  The back of the labrum had marked complex tearing throughout.  That was also debrided.  At the top of the biceps tendon, biceps anchor was still intact.  Articular cartilage looked good. Undersurface of the cuff looked good.  Cannula redirected subacromially. Marked reactive bursitis debrided.  A little bit of abrasive changes on the top of the cuff debrided.  Acromioplasty from type 2 to type 1 acromion releasing the CA ligament.  Marked grade 4 changes.  Distal clavicle was spurring, had edema and fluid.  Periarticular spurs lateral centimeter of clavicle resected.  Adequacy of decompression and debridement confirmed viewing from all portals. Instruments were removed.  Portals were closed with nylon.  Sterile compressive dressing applied.  Sling applied.  Anesthesia reversed. Brought to the recovery room.  Tolerated the surgery well with no complications.     Ninetta Lights, M.D.     DFM/MEDQ  D:  07/09/2015  T:  07/09/2015  Job:  [id number]

## 2015-07-16 ENCOUNTER — Encounter (HOSPITAL_BASED_OUTPATIENT_CLINIC_OR_DEPARTMENT_OTHER): Payer: Self-pay | Admitting: Orthopedic Surgery

## 2016-01-19 DIAGNOSIS — H6123 Impacted cerumen, bilateral: Secondary | ICD-10-CM | POA: Insufficient documentation

## 2016-01-19 DIAGNOSIS — H6062 Unspecified chronic otitis externa, left ear: Secondary | ICD-10-CM | POA: Insufficient documentation

## 2016-05-20 ENCOUNTER — Emergency Department (HOSPITAL_COMMUNITY)
Admission: EM | Admit: 2016-05-20 | Discharge: 2016-05-21 | Disposition: A | Discharge status: Home / Self Care | Payer: No Typology Code available for payment source | Attending: Emergency Medicine | Admitting: Emergency Medicine

## 2016-05-20 ENCOUNTER — Encounter (HOSPITAL_COMMUNITY): Payer: Self-pay | Admitting: *Deleted

## 2016-05-20 DIAGNOSIS — T63441A Toxic effect of venom of bees, accidental (unintentional), initial encounter: Secondary | ICD-10-CM | POA: Diagnosis present

## 2016-05-20 DIAGNOSIS — F17210 Nicotine dependence, cigarettes, uncomplicated: Secondary | ICD-10-CM | POA: Diagnosis not present

## 2016-05-20 MED ORDER — PREDNISONE 20 MG PO TABS
60.0000 mg | ORAL_TABLET | Freq: Once | ORAL | Status: AC
  Administered 2016-05-21: 60 mg via ORAL
  Filled 2016-05-20: qty 3

## 2016-05-20 MED ORDER — FAMOTIDINE 20 MG PO TABS: 20.0000 mg | ORAL_TABLET | Freq: Two times a day (BID) | ORAL | 0 refills | Status: DC

## 2016-05-20 MED ORDER — EPINEPHRINE 0.3 MG/0.3ML IJ SOAJ: 0.3000 mg | Freq: Once | INTRAMUSCULAR | 1 refills | Status: AC | PRN

## 2016-05-20 MED ORDER — FAMOTIDINE 20 MG PO TABS
40.0000 mg | ORAL_TABLET | Freq: Once | ORAL | Status: AC
  Administered 2016-05-21: 40 mg via ORAL
  Filled 2016-05-20: qty 2

## 2016-05-20 MED ORDER — DIPHENHYDRAMINE HCL 25 MG PO TABS: 25.0000 mg | ORAL_TABLET | Freq: Three times a day (TID) | ORAL | 0 refills | Status: DC | PRN

## 2016-05-20 NOTE — Discharge Instructions (Signed)
Take Benadryl and Pepcid as prescribed for persistent allergic reaction. These can be purchased over the counter or you may have a prescription filled by a pharmacist. Use an EpiPen if you experience worsening reaction such as significant facials swelling or tongue swelling, inability or difficulty swallowing, or difficulty breathing. Return immediately to the ED for worsening symptoms. Follow up with your primary care doctor as needed regarding your visit today.

## 2016-05-20 NOTE — ED Triage Notes (Signed)
The pt was stung multiple  Times scattered over his body around 1900  45 minutes ago he began to have sl swelling to the lt side of his lower face.  He took advil  Around [employer]

## 2016-05-21 NOTE — ED Notes (Signed)
Patient Alert and oriented X4. Stable and ambulatory. Patient verbalized understanding of the discharge instructions.  Patient belongings were taken by the patient.  

## 2016-05-21 NOTE — ED Provider Notes (Signed)
[facility] DEPT Provider Note   CSN: [id number] Arrival date & time: 05/20/16  2145    History   Chief Complaint Chief Complaint  Patient presents with  . Insect Bite    HPI Jay Williams is a 50 y.o. male.  50 year old male with no significant past medical history presents to the emergency department for evaluation of swelling to the left side of his upper lip. Patient states that symptoms began at approximately 9 PM. This was following the patient being stung multiple times by, what he believes to be, yellow jackets. Patient reports approximately 6 stings at 1900. He reports taking 3 Advil tablets and icing his bee stings after the incident. Patient reporting an associated mild tingling sensation to the left part of his upper lip. He does not think that his swelling has worsened since onset and he has had no difficulty breathing or swallowing. No associated rash. He denies any known history of anaphylaxis to bee stings.   The history is provided by the patient. No language interpreter was used.    Past Medical History:  Diagnosis Date  . Arthritis    shoulders  . Shoulder impingement    right    Patient Active Problem List   Diagnosis Date Noted  . LEG PAIN, RIGHT 03/23/2010  . STRESS FRACTURE OF TIBIA OR FIBULA 03/23/2010    Past Surgical History:  Procedure Laterality Date  . HAND SURGERY Right   . SHOULDER ARTHROSCOPY WITH DISTAL CLAVICLE RESECTION Right 07/09/2015   Procedure: SHOULDER ARTHROSCOPY WITH DISTAL CLAVICLE RESECTION;  Surgeon: Ninetta Lights, MD;  Location: [place];  Service: Orthopedics;  Laterality: Right;  . SHOULDER ARTHROSCOPY WITH SUBACROMIAL DECOMPRESSION Right 07/09/2015   Procedure: SHOULDER ARTHROSCOPY WITH SUBACROMIAL DECOMPRESSION;  Surgeon: Ninetta Lights, MD;  Location: [facility];  Service: Orthopedics;  Laterality: Right;       Home Medications    Prior to Admission medications   Medication  Sig Start Date End Date Taking? Authorizing Provider  diphenhydrAMINE (BENADRYL) 25 MG tablet Take 1-2 tablets (25-50 mg total) by mouth every 8 (eight) hours as needed for itching or allergies (rash or swelling). 05/20/16   Antonietta Breach, PA-C  EPINEPHrine (EPIPEN 2-PAK) 0.3 mg/0.3 mL IJ SOAJ injection Inject 0.3 mLs (0.3 mg total) into the muscle once as needed (for severe allergic reaction). To be used if you have significant facial swelling, difficulty swallowing, or shortness of breath. CAll 911 immediately if you have to use this medicine. 05/20/16   Antonietta Breach, PA-C  famotidine (PEPCID) 20 MG tablet Take 1 tablet (20 mg total) by mouth 2 (two) times daily. For persistent allergic reaction 05/20/16   Antonietta Breach, PA-C  ondansetron (ZOFRAN) 4 MG tablet Take 1 tablet (4 mg total) by mouth every 8 (eight) hours as needed for nausea or vomiting. 07/09/15   Aundra Dubin, PA-C  oxyCODONE-acetaminophen (ROXICET) 5-325 MG tablet Take 1-2 tablets by mouth every 4 (four) hours as needed. 07/09/15   Aundra Dubin, PA-C    Family History No family history on file.  Social History Social History  Substance Use Topics  . Smoking status: Current Some Day Smoker    Types: Cigars  . Smokeless tobacco: Never Used  . Alcohol use Yes     Comment: social     Allergies   Review of patient's allergies indicates no known allergies.   Review of Systems Review of Systems  Constitutional: Negative for fever.  HENT: Positive for  facial swelling. Negative for trouble swallowing.   Respiratory: Negative for shortness of breath.   Skin: Positive for wound (bee stings).  Ten systems reviewed and are negative for acute change, except as noted in the HPI.     Physical Exam Updated Vital Signs BP 120/96 (BP Location: Right Arm)   Pulse 70   Temp 98.4 F (36.9 C) (Oral)   Resp 18   SpO2 100%   Physical Exam  Constitutional: He is oriented to person, place, and time. He appears well-developed and  well-nourished. No distress.  Nontoxic appearing and in no distress  HENT:  Head: Normocephalic and atraumatic.  Mouth/Throat:    Uvula midline. No tongue swelling. No edema to posterior oropharynx. No tripoding or stridor. Patient tolerating secretions without difficulty.  Eyes: Conjunctivae and EOM are normal. No scleral icterus.  Neck: Normal range of motion.  Cardiovascular: Normal rate, regular rhythm and intact distal pulses.   Pulmonary/Chest: Effort normal. No respiratory distress. He has no wheezes. He has no rales.  Respirations even and unlabored. Lungs clear bilaterally.  Musculoskeletal: Normal range of motion.  Neurological: He is alert and oriented to person, place, and time.  Ambulatory with steady gait  Skin: Skin is warm and dry. No rash noted. He is not diaphoretic. No erythema. No pallor.  Psychiatric: He has a normal mood and affect. His behavior is normal.  Nursing note and vitals reviewed.    ED Treatments / Results  Labs (all labs ordered are listed, but only abnormal results are displayed) Labs Reviewed - No data to display  EKG  EKG Interpretation None       Radiology No results found.  Procedures Procedures (including critical care time)  Medications Ordered in ED Medications  predniSONE (DELTASONE) tablet 60 mg (60 mg Oral Given 05/21/16 0017)  famotidine (PEPCID) tablet 40 mg (40 mg Oral Given 05/21/16 0017)     Initial Impression / Assessment and Plan / ED Course  I have reviewed the triage vital signs and the nursing notes.  Pertinent labs & imaging results that were available during my care of the patient were reviewed by me and considered in my medical decision making (see chart for details).  Clinical Course    50 year old male presents to the emergency department for some localized swelling to his left upper lip after being stung by 6 yellow jackets. Mild angioedema has been stable over the past 3 hours. Patient denies any  worsening. Angioedema, on my exam, is very mild. It does not extend posterior oropharynx. Patient with no tongue swelling. No tripoding or stridor. No hypoxia. Lungs are clear to auscultation bilaterally. Patient denies shortness of breath or difficulty swallowing.  Given stability of symptoms, I do not believe the patient warrants further emergent monitoring. I have discussed supportive care with Benadryl, Pepcid, and prednisone. Patient to be given a prescription for an EpiPen for outpatient use, should his symptoms worsen. Primary care follow-up advised and return precautions given. Patient discharged in satisfactory condition with no unaddressed concerns.   Vitals:   05/20/16 2150 05/21/16 0010  BP: 128/94 120/96  Pulse: 72 70  Resp: 18 18  Temp: 98.4 F (36.9 C)   TempSrc: Oral   SpO2: 98% 100%    Final Clinical Impressions(s) / ED Diagnoses   Final diagnoses:  Bee sting reaction, accidental or unintentional, initial encounter    New Prescriptions Discharge Medication List as of 05/20/2016 11:51 PM    START taking these medications   Details  diphenhydrAMINE (BENADRYL) 25 MG tablet Take 1-2 tablets (25-50 mg total) by mouth every 8 (eight) hours as needed for itching or allergies (rash or swelling)., Starting Fri 05/20/2016, Print    EPINEPHrine (EPIPEN 2-PAK) 0.3 mg/0.3 mL IJ SOAJ injection Inject 0.3 mLs (0.3 mg total) into the muscle once as needed (for severe allergic reaction). To be used if you have significant facial swelling, difficulty swallowing, or shortness of breath. CAll 911 immediately if you have to use this medicine., Starti ng Fri 05/20/2016, Print    famotidine (PEPCID) 20 MG tablet Take 1 tablet (20 mg total) by mouth 2 (two) times daily. For persistent allergic reaction, Starting Fri 05/20/2016, Print         [place], PA-C 05/21/16 0044    Fatima Blank, MD 05/21/16 [phone]

## 2017-03-11 IMAGING — MR MR SHOULDER*R* W/O CM
5 series · 33 of 40 positions shown · non-contrast
Comparison: None.

CLINICAL DATA: 49-year-old with right shoulder pain for a few
years. No known injury or prior relevant surgery. Cortisone
injection 2 weeks prior. Initial encounter.

EXAM:
MRI OF THE RIGHT SHOULDER WITHOUT CONTRAST
TECHNIQUE: Multiplanar, multisequence MR imaging of the shoulder was performed.
No intravenous contrast was administered.

[Series 3: T2 fat-sat · axial · 4.0mm · 0.59mm/px · z∈[-33,+65]mm · 8 of 22 slices shown (1 of 3)]
[im 1/22]
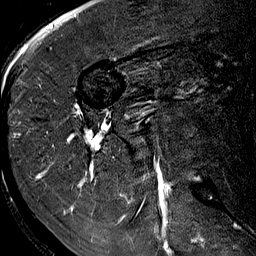
[im 4/22]
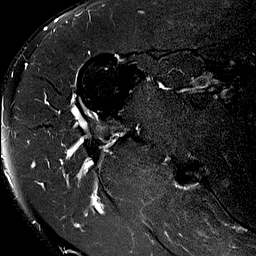
[im 7/22]
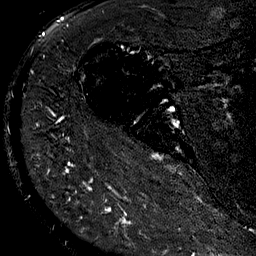
[im 10/22]
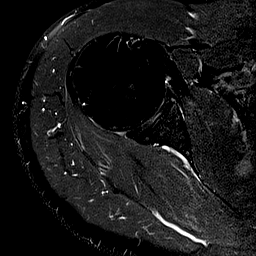
[im 13/22]
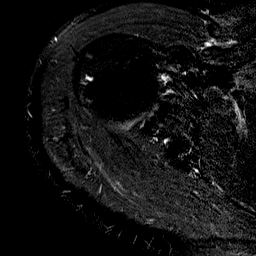
[im 16/22]
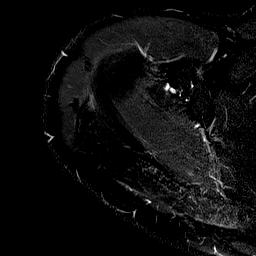
[im 19/22]
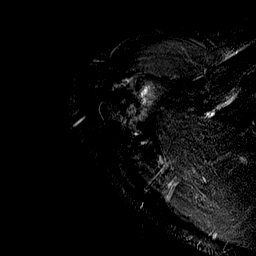
[im 22/22]
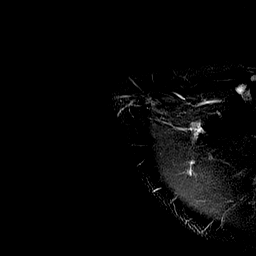

[Series 4: T2 fat-sat · oblique · 4.0mm · 0.62mm/px · 8 of 20 slices shown (2 of 3)]
[im 1/20]
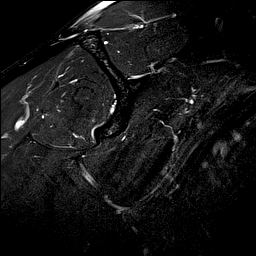
[im 3/20]
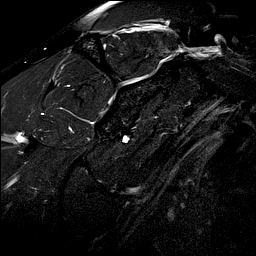
[im 6/20]
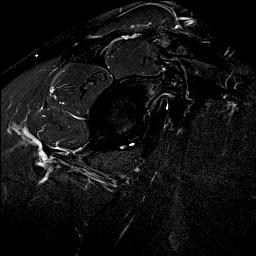
[im 9/20]
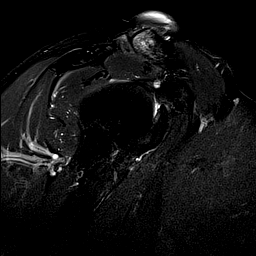
[im 11/20]
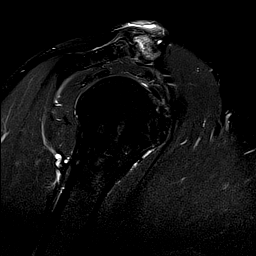
[im 14/20]
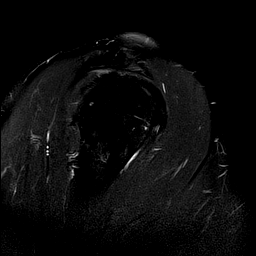
[im 17/20]
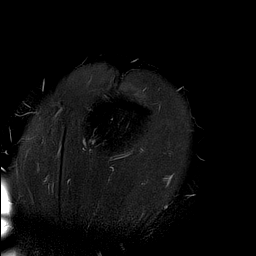
[im 20/20]
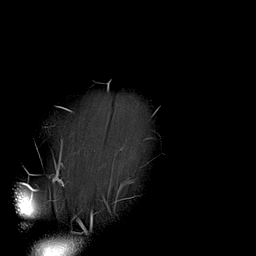

[Series 5: T1 · oblique · 4.0mm · 0.25mm/px · 1 of 20 slices shown]
[im 1/20]
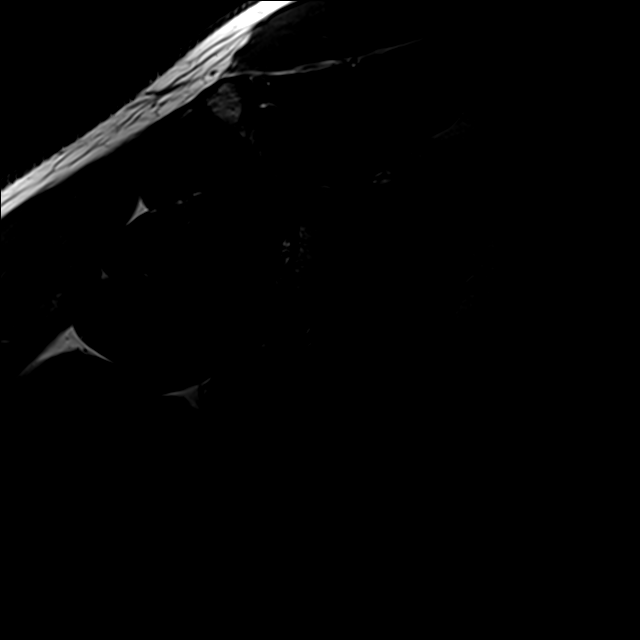

[Series 6: T2 fat-sat · sagittal · 4.0mm · 0.31mm/px · 8 of 19 slices shown (3 of 3)]
[im 1/19]
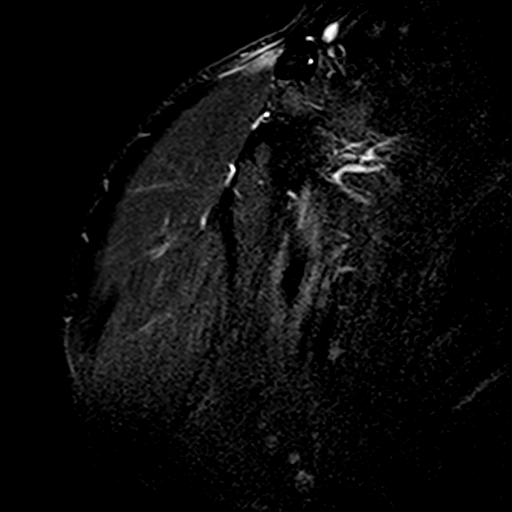
[im 3/19]
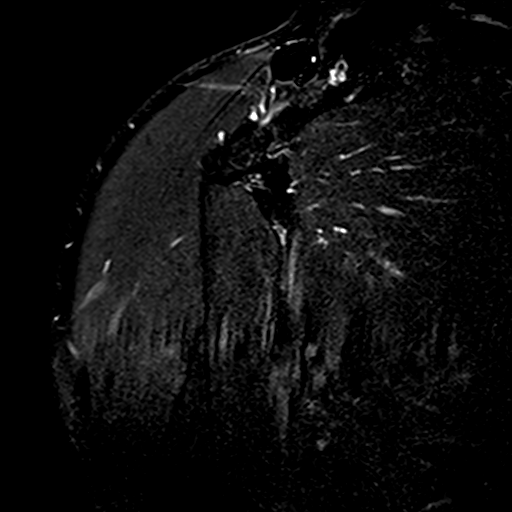
[im 6/19]
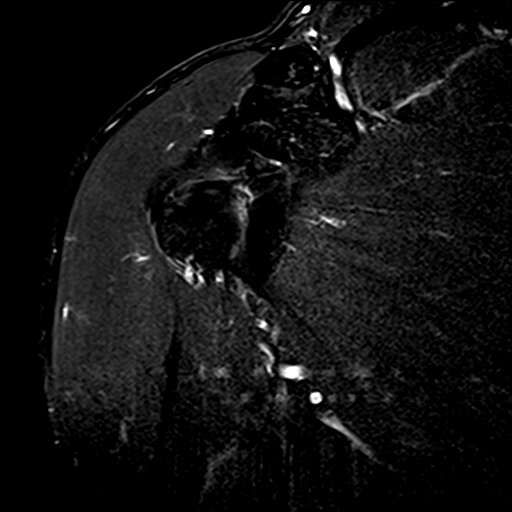
[im 8/19]
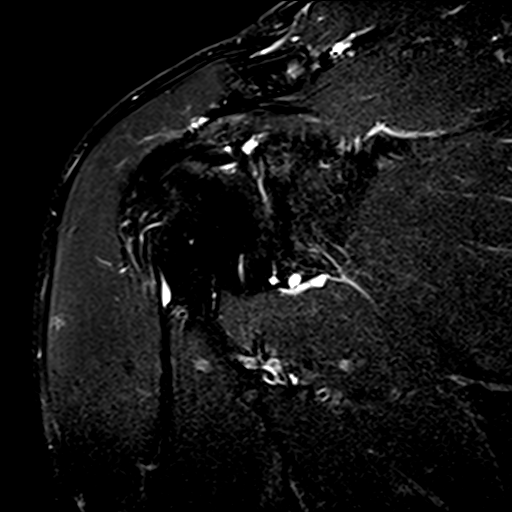
[im 11/19]
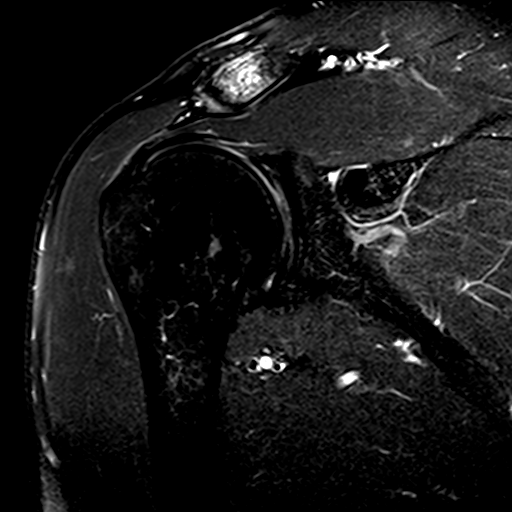
[im 13/19]
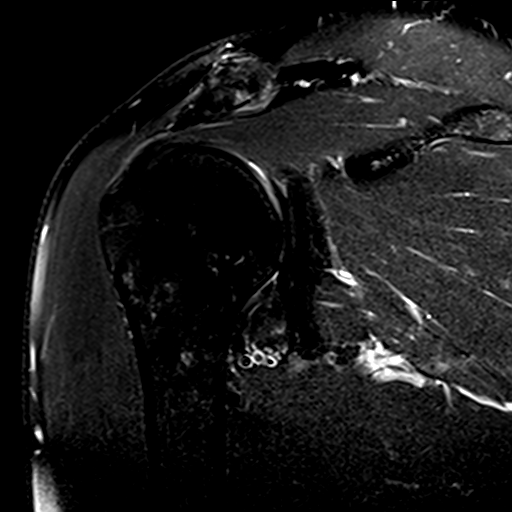
[im 16/19]
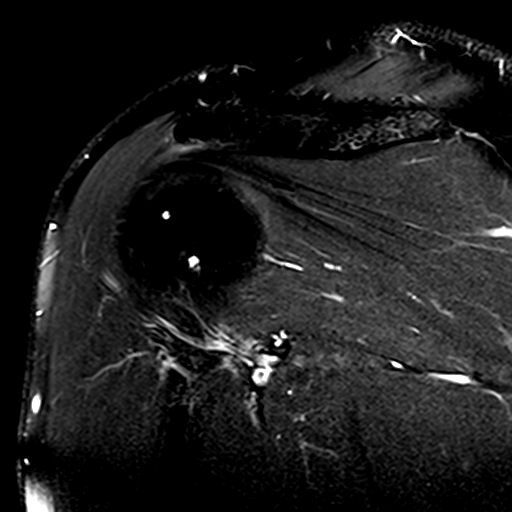
[im 19/19]
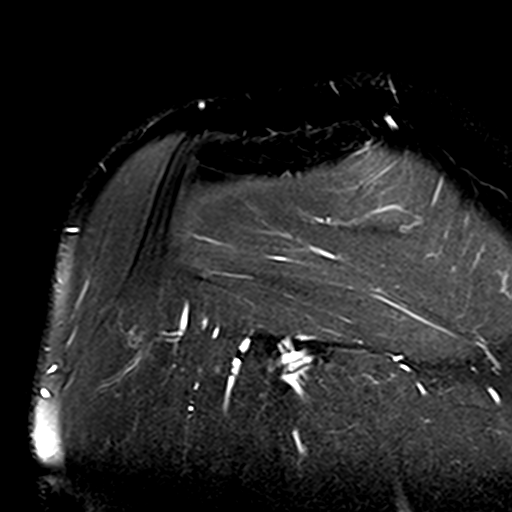

[Series 7: PD · sagittal · 4.0mm · 0.50mm/px · 8 of 19 slices shown]
[im 1/19]
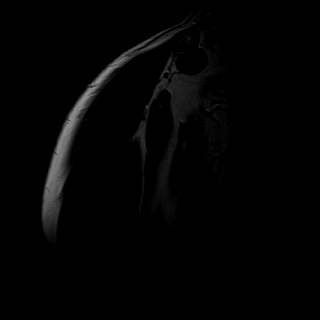
[im 3/19]
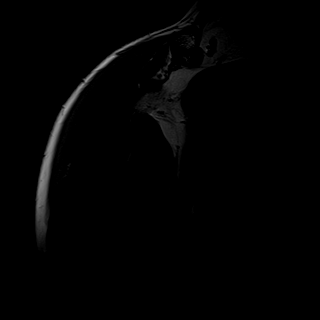
[im 6/19]
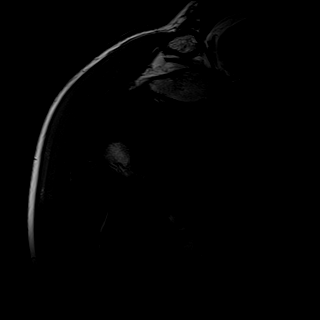
[im 8/19]
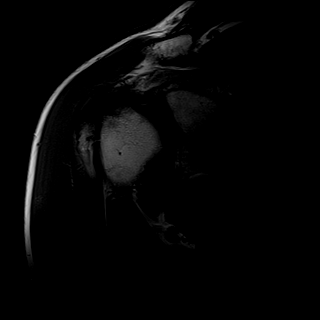
[im 11/19]
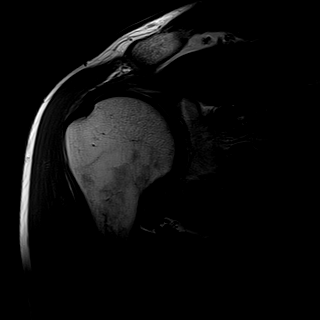
[im 13/19]
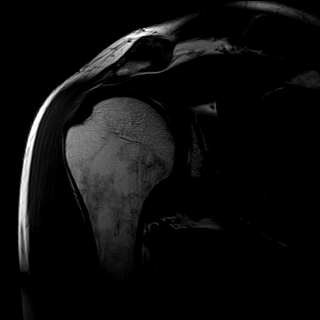
[im 16/19]
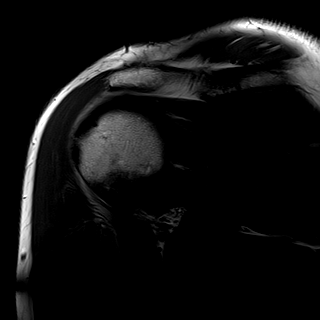
[im 19/19]
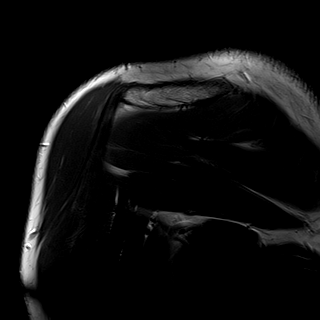

[33 of 40 positions shown; findings below may reference images not displayed]

FINDINGS: Rotator cuff:  Intact without significant tendinosis.

Muscles:  No focal muscular atrophy or edema.

Biceps long head:  Intact and normally positioned.

Acromioclavicular Joint: The acromion is type 1. There are
acromioclavicular degenerative changes with marrow edema in the
distal clavicle. No osteolysis or significant fluid within the
subacromial -subdeltoid bursa. Mild lateral downsloping of the
acromion.

Glenohumeral Joint: Mild glenohumeral degenerative changes. No
significant joint effusion.

Labrum: Lobulated cyst draping along the anterior inferior aspect of
the glenoid measures up to 1.6 cm on axial image 17. This abuts and
extends into the substance of the adjacent labrum, consistent with a
labral tear and paralabral cyst formation. No significant capsular
thickening. There is also intrasubstance signal in the superior
labrum.

Bones: No significant extra-articular osseous findings. No
Hill-Sachs deformity.
IMPRESSION: 1. Paralabral cyst along the anterior inferior aspect of the glenoid
consistent with an underlying labral tear. Potential tear of the
superior labrum as well. No displaced labral tear identified.
2. No signs of previous shoulder dislocation.
3. The rotator cuff demonstrates no significant findings.
4. Acromioclavicular degenerative changes with marrow edema in the
distal clavicle.

## 2018-05-02 DIAGNOSIS — H6123 Impacted cerumen, bilateral: Secondary | ICD-10-CM | POA: Diagnosis not present

## 2018-05-31 DIAGNOSIS — L814 Other melanin hyperpigmentation: Secondary | ICD-10-CM | POA: Diagnosis not present

## 2018-05-31 DIAGNOSIS — D225 Melanocytic nevi of trunk: Secondary | ICD-10-CM | POA: Diagnosis not present

## 2018-05-31 DIAGNOSIS — L573 Poikiloderma of Civatte: Secondary | ICD-10-CM | POA: Diagnosis not present

## 2018-10-23 DIAGNOSIS — R0781 Pleurodynia: Secondary | ICD-10-CM | POA: Diagnosis not present

## 2018-11-21 DIAGNOSIS — R82998 Other abnormal findings in urine: Secondary | ICD-10-CM | POA: Diagnosis not present

## 2018-11-21 DIAGNOSIS — Z0000 Encounter for general adult medical examination without abnormal findings: Secondary | ICD-10-CM | POA: Diagnosis not present

## 2018-11-21 DIAGNOSIS — Z125 Encounter for screening for malignant neoplasm of prostate: Secondary | ICD-10-CM | POA: Diagnosis not present

## 2019-07-22 ENCOUNTER — Other Ambulatory Visit: Payer: Self-pay

## 2019-07-22 DIAGNOSIS — Z20822 Contact with and (suspected) exposure to covid-19: Secondary | ICD-10-CM

## 2019-07-23 LAB — NOVEL CORONAVIRUS, NAA: SARS-CoV-2, NAA: NOT DETECTED

## 2019-11-22 DIAGNOSIS — E663 Overweight: Secondary | ICD-10-CM | POA: Diagnosis not present

## 2019-11-22 DIAGNOSIS — Z125 Encounter for screening for malignant neoplasm of prostate: Secondary | ICD-10-CM | POA: Diagnosis not present

## 2019-11-22 DIAGNOSIS — R7989 Other specified abnormal findings of blood chemistry: Secondary | ICD-10-CM | POA: Diagnosis not present

## 2019-11-22 DIAGNOSIS — Z0000 Encounter for general adult medical examination without abnormal findings: Secondary | ICD-10-CM | POA: Diagnosis not present

## 2019-11-22 DIAGNOSIS — R82998 Other abnormal findings in urine: Secondary | ICD-10-CM | POA: Diagnosis not present

## 2019-12-12 DIAGNOSIS — Z0000 Encounter for general adult medical examination without abnormal findings: Secondary | ICD-10-CM | POA: Diagnosis not present

## 2019-12-12 DIAGNOSIS — Z1331 Encounter for screening for depression: Secondary | ICD-10-CM | POA: Diagnosis not present

## 2019-12-25 ENCOUNTER — Encounter: Payer: Self-pay | Admitting: Gastroenterology

## 2020-01-09 ENCOUNTER — Other Ambulatory Visit: Payer: Self-pay

## 2020-01-09 ENCOUNTER — Ambulatory Visit (AMBULATORY_SURGERY_CENTER): Payer: Self-pay | Admitting: *Deleted

## 2020-01-09 VITALS — Temp 97.0°F | Ht 71.0 in | Wt 200.0 lb

## 2020-01-09 DIAGNOSIS — Z1211 Encounter for screening for malignant neoplasm of colon: Secondary | ICD-10-CM

## 2020-01-09 MED ORDER — SUTAB 1479-225-188 MG PO TABS: 1.0000 | ORAL_TABLET | Freq: Once | ORAL | 0 refills | Status: AC

## 2020-01-09 NOTE — Progress Notes (Signed)

## 2020-01-20 ENCOUNTER — Encounter: Payer: Self-pay | Admitting: Gastroenterology

## 2020-01-21 ENCOUNTER — Telehealth: Payer: Self-pay | Admitting: Gastroenterology

## 2020-01-21 MED ORDER — SUTAB 1479-225-188 MG PO TABS: 1.0000 | ORAL_TABLET | Freq: Once | ORAL | 0 refills | Status: AC

## 2020-01-21 NOTE — Telephone Encounter (Signed)
Spoke with pt and told him I will leave him a Sutab sample on the third floor for him to pick up.  Emphasized the need to follow instructions he received at his previsit, not the ones on the side of the box

## 2020-01-21 NOTE — Telephone Encounter (Signed)
Sutab rx resent to pharmacy and pt notified.

## 2020-01-21 NOTE — Telephone Encounter (Signed)
Pt is scheduled for colonoscopy 12/24/19.  He stated that pharmacy does not carry Sutab and will have to order it.  Pharmacy tech was not familiar with Sutab.  Do we have samples to give pt?

## 2020-01-21 NOTE — Telephone Encounter (Signed)
Patient is calling- went to pick up prep and it was not there- procedure is 5/6. Asking for it to be called in. Pharmacy- [facility]- on [address]. Patient requesting a call when its called in so he knows to go get it.

## 2020-01-23 ENCOUNTER — Other Ambulatory Visit: Payer: Self-pay

## 2020-01-23 ENCOUNTER — Encounter: Payer: Self-pay | Admitting: Gastroenterology

## 2020-01-23 ENCOUNTER — Ambulatory Visit (AMBULATORY_SURGERY_CENTER): Payer: BC Managed Care – PPO | Admitting: Gastroenterology

## 2020-01-23 VITALS — BP 109/70 | HR 64 | Temp 96.3°F | Resp 16 | Ht 71.0 in | Wt 200.0 lb

## 2020-01-23 DIAGNOSIS — Z1211 Encounter for screening for malignant neoplasm of colon: Secondary | ICD-10-CM

## 2020-01-23 MED ORDER — SODIUM CHLORIDE 0.9 % IV SOLN: 500.0000 mL | Freq: Once | INTRAVENOUS | Status: DC

## 2020-01-23 NOTE — Patient Instructions (Signed)
Please read handouts provided. Continue present medications. Repeat colonoscopy in 10 years for screening purposes. Avoid or limit use of high dose aspirin, ibuprofen, naproxen, or other non-steriodal anti-inflammatory drugs.       YOU HAD AN ENDOSCOPIC PROCEDURE TODAY AT [facility]:   Refer to the procedure report that was given to you for any specific questions about what was found during the examination.  If the procedure report does not answer your questions, please call your gastroenterologist to clarify.  If you requested that your care partner not be given the details of your procedure findings, then the procedure report has been included in a sealed envelope for you to review at your convenience later.  YOU SHOULD EXPECT: Some feelings of bloating in the abdomen. Passage of more gas than usual.  Walking can help get rid of the air that was put into your GI tract during the procedure and reduce the bloating. If you had a lower endoscopy (such as a colonoscopy or flexible sigmoidoscopy) you may notice spotting of blood in your stool or on the toilet paper. If you underwent a bowel prep for your procedure, you may not have a normal bowel movement for a few days.  Please Note:  You might notice some irritation and congestion in your nose or some drainage.  This is from the oxygen used during your procedure.  There is no need for concern and it should clear up in a day or so.  SYMPTOMS TO REPORT IMMEDIATELY:   Following lower endoscopy (colonoscopy or flexible sigmoidoscopy):  Excessive amounts of blood in the stool  Significant tenderness or worsening of abdominal pains  Swelling of the abdomen that is new, acute  Fever of 100F or higher   For urgent or emergent issues, a gastroenterologist can be reached at any hour by calling [phone]. Do not use MyChart messaging for urgent concerns.    DIET:  We do recommend a small meal at first, but then you may  proceed to your regular diet.  Drink plenty of fluids but you should avoid alcoholic beverages for 24 hours.  ACTIVITY:  You should plan to take it easy for the rest of today and you should NOT DRIVE or use heavy machinery until tomorrow (because of the sedation medicines used during the test).    FOLLOW UP: Our staff will call the number listed on your records 48-72 hours following your procedure to check on you and address any questions or concerns that you may have regarding the information given to you following your procedure. If we do not reach you, we will leave a message.  We will attempt to reach you two times.  During this call, we will ask if you have developed any symptoms of COVID 19. If you develop any symptoms (ie: fever, flu-like symptoms, shortness of breath, cough etc.) before then, please call [phone].  If you test positive for Covid 19 in the 2 weeks post procedure, please call and report this information to [place].    If any biopsies were taken you will be contacted by phone or by letter within the next 1-3 weeks.  Please call us at [phone] if you have not heard about the biopsies in 3 weeks.    SIGNATURES/CONFIDENTIALITY: You and/or your care partner have signed paperwork which will be entered into your electronic medical record.  These signatures attest to the fact that that the information above on your After Visit Summary has been reviewed and  is understood.  Full responsibility of the confidentiality of this discharge information lies with you and/or your care-partner.

## 2020-01-23 NOTE — Op Note (Signed)
[facility] Patient Name: Jay Williams Procedure Date: 01/23/2020 11:11 AM MRN: [id number] Endoscopist: Mauri Pole , MD Age: 54 Referring MD:  Date of Birth: [birth year] Gender: Male Account #: [id number] Procedure:                Colonoscopy Indications:              Screening for colorectal malignant neoplasm Medicines:                Monitored Anesthesia Care Procedure:                Pre-Anesthesia Assessment:                           - Prior to the procedure, a History and Physical                            was performed, and patient medications and                            allergies were reviewed. The patient's tolerance of                            previous anesthesia was also reviewed. The risks                            and benefits of the procedure and the sedation                            options and risks were discussed with the patient.                            All questions were answered, and informed consent                            was obtained. Prior Anticoagulants: The patient has                            taken no previous anticoagulant or antiplatelet                            agents. ASA Grade Assessment: I - A normal, healthy                            patient. After reviewing the risks and benefits,                            the patient was deemed in satisfactory condition to                            undergo the procedure.                           After obtaining informed consent, the colonoscope  was passed under direct vision. Throughout the                            procedure, the patient's blood pressure, pulse, and                            oxygen saturations were monitored continuously. The                            Colonoscope was introduced through the anus and                            advanced to the the cecum, identified by                            appendiceal orifice and ileocecal  valve. The                            colonoscopy was performed without difficulty. The                            patient tolerated the procedure well. The quality                            of the bowel preparation was excellent. The                            ileocecal valve, appendiceal orifice, and rectum                            were photographed. Scope In: 11:19:09 AM Scope Out: 11:30:26 AM Scope Withdrawal Time: 0 hours 7 minutes 41 seconds  Total Procedure Duration: 0 hours 11 minutes 17 seconds  Findings:                 The terminal ileum contained a few patchy erosions.                           The perianal and digital rectal examinations were                            normal.                           A few small-mouthed diverticula were found in the                            sigmoid colon.                           Non-bleeding internal hemorrhoids were found during                            retroflexion. The hemorrhoids were small.  The exam was otherwise without abnormality. Complications:            No immediate complications. Estimated Blood Loss:     Estimated blood loss: none. Impression:               - A few erosions in the terminal ileum.                           - Diverticulosis in the sigmoid colon.                           - Non-bleeding internal hemorrhoids.                           - The examination was otherwise normal.                           - No specimens collected. Recommendation:           - Patient has a contact number available for                            emergencies. The signs and symptoms of potential                            delayed complications were discussed with the                            patient. Return to normal activities tomorrow.                            Written discharge instructions were provided to the                            patient.                           - Resume previous diet.                            - Continue present medications.                           - Repeat colonoscopy in 10 years for screening                            purposes.                           - Avoid or limit use of high dose aspirin,                            ibuprofen, naproxen, or other non-steroidal                            anti-inflammatory drugs. Mauri Pole, MD 01/23/2020 11:38:45 AM This report has been signed electronically. Richard W [place],

## 2020-01-23 NOTE — Progress Notes (Signed)
To PACU, VSS. Report to [email or web address] 

## 2020-01-27 ENCOUNTER — Telehealth: Payer: Self-pay | Admitting: *Deleted

## 2020-01-27 NOTE — Telephone Encounter (Signed)
  Follow up Call-  Call back number 01/23/2020  Post procedure Call Back phone  # [phone]  Permission to leave phone message Yes  Some recent data might be hidden     Patient questions:  Do you have a fever, pain , or abdominal swelling? No. Pain Score  0 *  Have you tolerated food without any problems? Yes.    Have you been able to return to your normal activities? Yes.    Do you have any questions about your discharge instructions: Diet   No. Medications  No. Follow up visit  No.  Do you have questions or concerns about your Care? No.  Actions: * If pain score is 4 or above: No action needed, pain <4.  1. Have you developed a fever since your procedure? no  2.   Have you had an respiratory symptoms (SOB or cough) since your procedure? no  3.   Have you tested positive for COVID 19 since your procedure no  4.   Have you had any family members/close contacts diagnosed with the COVID 19 since your procedure?  no   If yes to any of these questions please route to Joylene John, RN and Erenest Rasher, RN

## 2020-02-04 DIAGNOSIS — H60543 Acute eczematoid otitis externa, bilateral: Secondary | ICD-10-CM | POA: Diagnosis not present

## 2020-02-04 DIAGNOSIS — H6123 Impacted cerumen, bilateral: Secondary | ICD-10-CM | POA: Diagnosis not present

## 2020-02-19 DIAGNOSIS — R269 Unspecified abnormalities of gait and mobility: Secondary | ICD-10-CM | POA: Diagnosis not present

## 2020-02-19 DIAGNOSIS — M79605 Pain in left leg: Secondary | ICD-10-CM | POA: Diagnosis not present

## 2020-02-20 DIAGNOSIS — L814 Other melanin hyperpigmentation: Secondary | ICD-10-CM | POA: Diagnosis not present

## 2020-02-20 DIAGNOSIS — D225 Melanocytic nevi of trunk: Secondary | ICD-10-CM | POA: Diagnosis not present

## 2020-02-20 DIAGNOSIS — D2271 Melanocytic nevi of right lower limb, including hip: Secondary | ICD-10-CM | POA: Diagnosis not present

## 2020-02-20 DIAGNOSIS — D1801 Hemangioma of skin and subcutaneous tissue: Secondary | ICD-10-CM | POA: Diagnosis not present

## 2020-05-06 DIAGNOSIS — R6884 Jaw pain: Secondary | ICD-10-CM | POA: Diagnosis not present

## 2020-05-06 DIAGNOSIS — M545 Low back pain: Secondary | ICD-10-CM | POA: Diagnosis not present

## 2020-05-26 DIAGNOSIS — M545 Low back pain: Secondary | ICD-10-CM | POA: Diagnosis not present

## 2020-05-26 DIAGNOSIS — R6884 Jaw pain: Secondary | ICD-10-CM | POA: Diagnosis not present

## 2020-06-03 DIAGNOSIS — R6884 Jaw pain: Secondary | ICD-10-CM | POA: Diagnosis not present

## 2020-06-03 DIAGNOSIS — M545 Low back pain: Secondary | ICD-10-CM | POA: Diagnosis not present

## 2020-06-30 DIAGNOSIS — H6123 Impacted cerumen, bilateral: Secondary | ICD-10-CM | POA: Diagnosis not present

## 2020-06-30 DIAGNOSIS — H60543 Acute eczematoid otitis externa, bilateral: Secondary | ICD-10-CM | POA: Diagnosis not present

## 2020-12-02 DIAGNOSIS — M545 Low back pain, unspecified: Secondary | ICD-10-CM | POA: Diagnosis not present

## 2020-12-09 DIAGNOSIS — M545 Low back pain, unspecified: Secondary | ICD-10-CM | POA: Diagnosis not present

## 2021-01-07 DIAGNOSIS — M545 Low back pain, unspecified: Secondary | ICD-10-CM | POA: Diagnosis not present

## 2021-01-08 DIAGNOSIS — M545 Low back pain, unspecified: Secondary | ICD-10-CM | POA: Diagnosis not present

## 2021-01-12 ENCOUNTER — Other Ambulatory Visit: Payer: Self-pay | Admitting: Orthopedic Surgery

## 2021-01-12 DIAGNOSIS — M4696 Unspecified inflammatory spondylopathy, lumbar region: Secondary | ICD-10-CM

## 2021-01-13 DIAGNOSIS — R7989 Other specified abnormal findings of blood chemistry: Secondary | ICD-10-CM | POA: Diagnosis not present

## 2021-01-13 DIAGNOSIS — Z0000 Encounter for general adult medical examination without abnormal findings: Secondary | ICD-10-CM | POA: Diagnosis not present

## 2021-01-19 ENCOUNTER — Ambulatory Visit
Admission: RE | Admit: 2021-01-19 | Discharge: 2021-01-19 | Disposition: A | Discharge status: Home / Self Care | Payer: BC Managed Care – PPO | Source: Ambulatory Visit | Attending: Orthopedic Surgery | Admitting: Orthopedic Surgery

## 2021-01-19 ENCOUNTER — Other Ambulatory Visit: Payer: Self-pay

## 2021-01-19 DIAGNOSIS — M545 Low back pain, unspecified: Secondary | ICD-10-CM | POA: Diagnosis not present

## 2021-01-19 DIAGNOSIS — M4696 Unspecified inflammatory spondylopathy, lumbar region: Secondary | ICD-10-CM

## 2021-01-20 DIAGNOSIS — R82998 Other abnormal findings in urine: Secondary | ICD-10-CM | POA: Diagnosis not present

## 2021-01-20 DIAGNOSIS — Z1331 Encounter for screening for depression: Secondary | ICD-10-CM | POA: Diagnosis not present

## 2021-01-20 DIAGNOSIS — Z1339 Encounter for screening examination for other mental health and behavioral disorders: Secondary | ICD-10-CM | POA: Diagnosis not present

## 2021-01-20 DIAGNOSIS — Z0000 Encounter for general adult medical examination without abnormal findings: Secondary | ICD-10-CM | POA: Diagnosis not present

## 2021-01-22 DIAGNOSIS — Z1212 Encounter for screening for malignant neoplasm of rectum: Secondary | ICD-10-CM | POA: Diagnosis not present

## 2021-01-24 ENCOUNTER — Other Ambulatory Visit: Payer: BC Managed Care – PPO

## 2021-01-25 DIAGNOSIS — M47816 Spondylosis without myelopathy or radiculopathy, lumbar region: Secondary | ICD-10-CM | POA: Diagnosis not present

## 2021-02-02 DIAGNOSIS — H60543 Acute eczematoid otitis externa, bilateral: Secondary | ICD-10-CM | POA: Diagnosis not present

## 2021-02-02 DIAGNOSIS — H6123 Impacted cerumen, bilateral: Secondary | ICD-10-CM | POA: Diagnosis not present

## 2021-02-10 DIAGNOSIS — M47816 Spondylosis without myelopathy or radiculopathy, lumbar region: Secondary | ICD-10-CM | POA: Diagnosis not present

## 2021-02-19 DIAGNOSIS — L218 Other seborrheic dermatitis: Secondary | ICD-10-CM | POA: Diagnosis not present

## 2021-03-09 DIAGNOSIS — M47816 Spondylosis without myelopathy or radiculopathy, lumbar region: Secondary | ICD-10-CM | POA: Diagnosis not present

## 2021-04-20 DIAGNOSIS — H6123 Impacted cerumen, bilateral: Secondary | ICD-10-CM | POA: Diagnosis not present

## 2021-04-20 DIAGNOSIS — H6092 Unspecified otitis externa, left ear: Secondary | ICD-10-CM | POA: Diagnosis not present

## 2021-05-11 DIAGNOSIS — H60543 Acute eczematoid otitis externa, bilateral: Secondary | ICD-10-CM | POA: Diagnosis not present

## 2021-05-11 DIAGNOSIS — H60391 Other infective otitis externa, right ear: Secondary | ICD-10-CM | POA: Diagnosis not present

## 2021-08-16 DIAGNOSIS — M47816 Spondylosis without myelopathy or radiculopathy, lumbar region: Secondary | ICD-10-CM | POA: Diagnosis not present

## 2021-09-01 ENCOUNTER — Other Ambulatory Visit (HOSPITAL_COMMUNITY): Payer: Self-pay | Admitting: Orthopedic Surgery

## 2021-09-01 ENCOUNTER — Other Ambulatory Visit: Payer: Self-pay | Admitting: Orthopedic Surgery

## 2021-09-01 DIAGNOSIS — M25561 Pain in right knee: Secondary | ICD-10-CM | POA: Diagnosis not present

## 2021-09-02 DIAGNOSIS — M25561 Pain in right knee: Secondary | ICD-10-CM | POA: Diagnosis not present

## 2021-09-07 ENCOUNTER — Ambulatory Visit (HOSPITAL_COMMUNITY): Payer: BC Managed Care – PPO

## 2021-09-09 DIAGNOSIS — X58XXXA Exposure to other specified factors, initial encounter: Secondary | ICD-10-CM | POA: Diagnosis not present

## 2021-09-09 DIAGNOSIS — S83271A Complex tear of lateral meniscus, current injury, right knee, initial encounter: Secondary | ICD-10-CM | POA: Diagnosis not present

## 2021-09-09 DIAGNOSIS — G8918 Other acute postprocedural pain: Secondary | ICD-10-CM | POA: Diagnosis not present

## 2021-09-09 DIAGNOSIS — S83241A Other tear of medial meniscus, current injury, right knee, initial encounter: Secondary | ICD-10-CM | POA: Diagnosis not present

## 2021-09-09 DIAGNOSIS — M2241 Chondromalacia patellae, right knee: Secondary | ICD-10-CM | POA: Diagnosis not present

## 2021-09-09 DIAGNOSIS — M94261 Chondromalacia, right knee: Secondary | ICD-10-CM | POA: Diagnosis not present

## 2021-09-09 DIAGNOSIS — Y999 Unspecified external cause status: Secondary | ICD-10-CM | POA: Diagnosis not present

## 2021-09-22 DIAGNOSIS — M2241 Chondromalacia patellae, right knee: Secondary | ICD-10-CM | POA: Diagnosis not present

## 2021-09-29 DIAGNOSIS — M2241 Chondromalacia patellae, right knee: Secondary | ICD-10-CM | POA: Diagnosis not present

## 2021-09-29 DIAGNOSIS — S83241A Other tear of medial meniscus, current injury, right knee, initial encounter: Secondary | ICD-10-CM | POA: Diagnosis not present

## 2021-10-26 DIAGNOSIS — M6281 Muscle weakness (generalized): Secondary | ICD-10-CM | POA: Diagnosis not present

## 2021-10-26 DIAGNOSIS — R262 Difficulty in walking, not elsewhere classified: Secondary | ICD-10-CM | POA: Diagnosis not present

## 2021-10-26 DIAGNOSIS — M23321 Other meniscus derangements, posterior horn of medial meniscus, right knee: Secondary | ICD-10-CM | POA: Diagnosis not present

## 2021-10-26 DIAGNOSIS — M25661 Stiffness of right knee, not elsewhere classified: Secondary | ICD-10-CM | POA: Diagnosis not present

## 2021-10-28 DIAGNOSIS — M6281 Muscle weakness (generalized): Secondary | ICD-10-CM | POA: Diagnosis not present

## 2021-10-28 DIAGNOSIS — R262 Difficulty in walking, not elsewhere classified: Secondary | ICD-10-CM | POA: Diagnosis not present

## 2021-10-28 DIAGNOSIS — M25661 Stiffness of right knee, not elsewhere classified: Secondary | ICD-10-CM | POA: Diagnosis not present

## 2021-10-28 DIAGNOSIS — M23321 Other meniscus derangements, posterior horn of medial meniscus, right knee: Secondary | ICD-10-CM | POA: Diagnosis not present

## 2021-11-02 DIAGNOSIS — M6281 Muscle weakness (generalized): Secondary | ICD-10-CM | POA: Diagnosis not present

## 2021-11-02 DIAGNOSIS — M23321 Other meniscus derangements, posterior horn of medial meniscus, right knee: Secondary | ICD-10-CM | POA: Diagnosis not present

## 2021-11-02 DIAGNOSIS — R262 Difficulty in walking, not elsewhere classified: Secondary | ICD-10-CM | POA: Diagnosis not present

## 2021-11-02 DIAGNOSIS — M25661 Stiffness of right knee, not elsewhere classified: Secondary | ICD-10-CM | POA: Diagnosis not present

## 2021-11-04 DIAGNOSIS — M6281 Muscle weakness (generalized): Secondary | ICD-10-CM | POA: Diagnosis not present

## 2021-11-04 DIAGNOSIS — M23321 Other meniscus derangements, posterior horn of medial meniscus, right knee: Secondary | ICD-10-CM | POA: Diagnosis not present

## 2021-11-04 DIAGNOSIS — R262 Difficulty in walking, not elsewhere classified: Secondary | ICD-10-CM | POA: Diagnosis not present

## 2021-11-04 DIAGNOSIS — M25661 Stiffness of right knee, not elsewhere classified: Secondary | ICD-10-CM | POA: Diagnosis not present

## 2021-11-09 DIAGNOSIS — M25661 Stiffness of right knee, not elsewhere classified: Secondary | ICD-10-CM | POA: Diagnosis not present

## 2021-11-09 DIAGNOSIS — M6281 Muscle weakness (generalized): Secondary | ICD-10-CM | POA: Diagnosis not present

## 2021-11-09 DIAGNOSIS — R262 Difficulty in walking, not elsewhere classified: Secondary | ICD-10-CM | POA: Diagnosis not present

## 2021-11-09 DIAGNOSIS — M23321 Other meniscus derangements, posterior horn of medial meniscus, right knee: Secondary | ICD-10-CM | POA: Diagnosis not present

## 2021-11-11 DIAGNOSIS — M23321 Other meniscus derangements, posterior horn of medial meniscus, right knee: Secondary | ICD-10-CM | POA: Diagnosis not present

## 2021-11-11 DIAGNOSIS — M25661 Stiffness of right knee, not elsewhere classified: Secondary | ICD-10-CM | POA: Diagnosis not present

## 2021-11-11 DIAGNOSIS — R262 Difficulty in walking, not elsewhere classified: Secondary | ICD-10-CM | POA: Diagnosis not present

## 2021-11-11 DIAGNOSIS — M6281 Muscle weakness (generalized): Secondary | ICD-10-CM | POA: Diagnosis not present

## 2022-01-18 DIAGNOSIS — D72819 Decreased white blood cell count, unspecified: Secondary | ICD-10-CM | POA: Diagnosis not present

## 2022-01-18 DIAGNOSIS — E7800 Pure hypercholesterolemia, unspecified: Secondary | ICD-10-CM | POA: Diagnosis not present

## 2022-01-18 DIAGNOSIS — Z125 Encounter for screening for malignant neoplasm of prostate: Secondary | ICD-10-CM | POA: Diagnosis not present

## 2022-01-18 DIAGNOSIS — R7301 Impaired fasting glucose: Secondary | ICD-10-CM | POA: Diagnosis not present

## 2022-01-25 DIAGNOSIS — Z1339 Encounter for screening examination for other mental health and behavioral disorders: Secondary | ICD-10-CM | POA: Diagnosis not present

## 2022-01-25 DIAGNOSIS — Z0000 Encounter for general adult medical examination without abnormal findings: Secondary | ICD-10-CM | POA: Diagnosis not present

## 2022-01-25 DIAGNOSIS — Z1331 Encounter for screening for depression: Secondary | ICD-10-CM | POA: Diagnosis not present

## 2022-05-16 DIAGNOSIS — M25532 Pain in left wrist: Secondary | ICD-10-CM | POA: Diagnosis not present

## 2022-05-17 DIAGNOSIS — H60543 Acute eczematoid otitis externa, bilateral: Secondary | ICD-10-CM | POA: Diagnosis not present

## 2022-11-03 DIAGNOSIS — H6123 Impacted cerumen, bilateral: Secondary | ICD-10-CM | POA: Diagnosis not present

## 2022-11-03 DIAGNOSIS — L309 Dermatitis, unspecified: Secondary | ICD-10-CM | POA: Diagnosis not present

## 2023-02-06 DIAGNOSIS — E7800 Pure hypercholesterolemia, unspecified: Secondary | ICD-10-CM | POA: Diagnosis not present

## 2023-02-06 DIAGNOSIS — D72819 Decreased white blood cell count, unspecified: Secondary | ICD-10-CM | POA: Diagnosis not present

## 2023-02-06 DIAGNOSIS — R7301 Impaired fasting glucose: Secondary | ICD-10-CM | POA: Diagnosis not present

## 2023-02-06 DIAGNOSIS — Z125 Encounter for screening for malignant neoplasm of prostate: Secondary | ICD-10-CM | POA: Diagnosis not present

## 2023-02-06 DIAGNOSIS — R7989 Other specified abnormal findings of blood chemistry: Secondary | ICD-10-CM | POA: Diagnosis not present

## 2023-02-09 DIAGNOSIS — Z1212 Encounter for screening for malignant neoplasm of rectum: Secondary | ICD-10-CM | POA: Diagnosis not present

## 2023-02-09 DIAGNOSIS — Z1331 Encounter for screening for depression: Secondary | ICD-10-CM | POA: Diagnosis not present

## 2023-02-09 DIAGNOSIS — Z0000 Encounter for general adult medical examination without abnormal findings: Secondary | ICD-10-CM | POA: Diagnosis not present

## 2023-02-09 DIAGNOSIS — R82998 Other abnormal findings in urine: Secondary | ICD-10-CM | POA: Diagnosis not present

## 2023-02-09 DIAGNOSIS — Z1339 Encounter for screening examination for other mental health and behavioral disorders: Secondary | ICD-10-CM | POA: Diagnosis not present

## 2023-03-02 DIAGNOSIS — R21 Rash and other nonspecific skin eruption: Secondary | ICD-10-CM | POA: Diagnosis not present

## 2023-03-02 DIAGNOSIS — R202 Paresthesia of skin: Secondary | ICD-10-CM | POA: Diagnosis not present

## 2023-03-02 DIAGNOSIS — R200 Anesthesia of skin: Secondary | ICD-10-CM | POA: Diagnosis not present

## 2023-03-03 DIAGNOSIS — B027 Disseminated zoster: Secondary | ICD-10-CM | POA: Diagnosis not present

## 2023-04-27 DIAGNOSIS — B0229 Other postherpetic nervous system involvement: Secondary | ICD-10-CM | POA: Diagnosis not present

## 2023-06-13 DIAGNOSIS — H60543 Acute eczematoid otitis externa, bilateral: Secondary | ICD-10-CM | POA: Diagnosis not present

## 2023-06-13 DIAGNOSIS — H6123 Impacted cerumen, bilateral: Secondary | ICD-10-CM | POA: Diagnosis not present

## 2023-10-19 DIAGNOSIS — H6123 Impacted cerumen, bilateral: Secondary | ICD-10-CM | POA: Diagnosis not present

## 2023-12-14 DIAGNOSIS — H6123 Impacted cerumen, bilateral: Secondary | ICD-10-CM | POA: Diagnosis not present

## 2024-02-14 DIAGNOSIS — Z1212 Encounter for screening for malignant neoplasm of rectum: Secondary | ICD-10-CM | POA: Diagnosis not present

## 2024-02-14 DIAGNOSIS — R7301 Impaired fasting glucose: Secondary | ICD-10-CM | POA: Diagnosis not present

## 2024-02-14 DIAGNOSIS — Z125 Encounter for screening for malignant neoplasm of prostate: Secondary | ICD-10-CM | POA: Diagnosis not present

## 2024-02-14 DIAGNOSIS — E7800 Pure hypercholesterolemia, unspecified: Secondary | ICD-10-CM | POA: Diagnosis not present

## 2024-02-21 DIAGNOSIS — Z1339 Encounter for screening examination for other mental health and behavioral disorders: Secondary | ICD-10-CM | POA: Diagnosis not present

## 2024-02-21 DIAGNOSIS — Z0000 Encounter for general adult medical examination without abnormal findings: Secondary | ICD-10-CM | POA: Diagnosis not present

## 2024-02-21 DIAGNOSIS — M545 Low back pain, unspecified: Secondary | ICD-10-CM | POA: Diagnosis not present

## 2024-02-21 DIAGNOSIS — Z1331 Encounter for screening for depression: Secondary | ICD-10-CM | POA: Diagnosis not present

## 2024-02-21 DIAGNOSIS — R82998 Other abnormal findings in urine: Secondary | ICD-10-CM | POA: Diagnosis not present

## 2024-05-23 DIAGNOSIS — H6123 Impacted cerumen, bilateral: Secondary | ICD-10-CM | POA: Diagnosis not present

## 2024-08-23 DIAGNOSIS — H6123 Impacted cerumen, bilateral: Secondary | ICD-10-CM | POA: Diagnosis not present
